# Patient Record
Sex: Female | Born: 1960 | Race: White | Hispanic: No | Marital: Married | Smoking: Former smoker
Health system: Southern US, Community
[De-identification: ages and names within clinical notes are randomized; demographics above are authoritative.]

## PROBLEM LIST (undated history)

## (undated) DIAGNOSIS — H269 Unspecified cataract: Secondary | ICD-10-CM

## (undated) DIAGNOSIS — I10 Essential (primary) hypertension: Secondary | ICD-10-CM

## (undated) HISTORY — DX: Unspecified cataract: H26.9

## (undated) HISTORY — PX: WISDOM TOOTH EXTRACTION: SHX21

## (undated) HISTORY — PX: CATARACT EXTRACTION, BILATERAL: SHX1313

## (undated) HISTORY — DX: Essential (primary) hypertension: I10

---
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
## 1987-12-13 HISTORY — PX: CHOLECYSTECTOMY: SHX55

## 1992-12-12 HISTORY — PX: RIGHT OOPHORECTOMY: SHX2359

## 2000-04-05 ENCOUNTER — Other Ambulatory Visit: Admission: RE | Admit: 2000-04-05 | Discharge: 2000-04-05 | Payer: Self-pay | Admitting: Obstetrics and Gynecology

## 2001-05-23 ENCOUNTER — Other Ambulatory Visit: Admission: RE | Admit: 2001-05-23 | Discharge: 2001-05-23 | Payer: Self-pay | Admitting: Obstetrics and Gynecology

## 2002-06-19 ENCOUNTER — Other Ambulatory Visit: Admission: RE | Admit: 2002-06-19 | Discharge: 2002-06-19 | Payer: Self-pay | Admitting: Obstetrics and Gynecology

## 2003-08-04 ENCOUNTER — Other Ambulatory Visit: Admission: RE | Admit: 2003-08-04 | Discharge: 2003-08-04 | Payer: Self-pay | Admitting: Obstetrics and Gynecology

## 2004-09-01 ENCOUNTER — Other Ambulatory Visit: Admission: RE | Admit: 2004-09-01 | Discharge: 2004-09-01 | Payer: Self-pay | Admitting: Obstetrics and Gynecology

## 2005-10-10 ENCOUNTER — Other Ambulatory Visit: Admission: RE | Admit: 2005-10-10 | Discharge: 2005-10-10 | Payer: Self-pay | Admitting: Obstetrics and Gynecology

## 2005-10-24 ENCOUNTER — Encounter: Admission: RE | Admit: 2005-10-24 | Discharge: 2005-10-24 | Payer: Self-pay | Admitting: Obstetrics and Gynecology

## 2011-01-01 ENCOUNTER — Encounter: Payer: Self-pay | Admitting: Obstetrics and Gynecology

## 2011-11-09 ENCOUNTER — Other Ambulatory Visit: Payer: Self-pay | Admitting: Obstetrics and Gynecology

## 2011-11-09 DIAGNOSIS — R928 Other abnormal and inconclusive findings on diagnostic imaging of breast: Secondary | ICD-10-CM

## 2011-11-23 ENCOUNTER — Ambulatory Visit
Admission: RE | Admit: 2011-11-23 | Discharge: 2011-11-23 | Disposition: A | Discharge status: Home / Self Care | Payer: BC Managed Care – PPO | Source: Ambulatory Visit | Attending: Obstetrics and Gynecology | Admitting: Obstetrics and Gynecology

## 2011-11-23 DIAGNOSIS — R928 Other abnormal and inconclusive findings on diagnostic imaging of breast: Secondary | ICD-10-CM

## 2011-12-13 HISTORY — PX: COLONOSCOPY: SHX174

## 2012-05-29 ENCOUNTER — Encounter: Payer: Self-pay | Admitting: Gastroenterology

## 2012-07-13 ENCOUNTER — Encounter: Payer: Self-pay | Admitting: Gastroenterology

## 2012-07-13 ENCOUNTER — Ambulatory Visit (AMBULATORY_SURGERY_CENTER): Payer: BC Managed Care – PPO | Admitting: *Deleted

## 2012-07-13 VITALS — Ht 63.0 in | Wt 160.0 lb

## 2012-07-13 DIAGNOSIS — Z1211 Encounter for screening for malignant neoplasm of colon: Secondary | ICD-10-CM

## 2012-07-13 MED ORDER — MOVIPREP 100 G PO SOLR: ORAL | Status: DC

## 2012-07-27 ENCOUNTER — Ambulatory Visit (AMBULATORY_SURGERY_CENTER): Payer: BC Managed Care – PPO | Admitting: Gastroenterology

## 2012-07-27 ENCOUNTER — Encounter: Payer: Self-pay | Admitting: Gastroenterology

## 2012-07-27 VITALS — BP 139/77 | HR 103 | Temp 99.1°F | Resp 18 | Ht 63.0 in | Wt 160.0 lb

## 2012-07-27 DIAGNOSIS — Z1211 Encounter for screening for malignant neoplasm of colon: Secondary | ICD-10-CM

## 2012-07-27 MED ORDER — SODIUM CHLORIDE 0.9 % IV SOLN: 500.0000 mL | INTRAVENOUS | Status: DC

## 2012-07-27 NOTE — Op Note (Signed)
[facility] [address] [place], [place]  [address]  COLONOSCOPY PROCEDURE REPORT  PATIENT:  Renee York, Renee York  MR#:  [id number] BIRTHDATE:  11-Jan-1961, 50 yrs. old  GENDER:  female ENDOSCOPIST:  Rachael Fee, MD REF. BY:  [place] Corn, M.D. PROCEDURE DATE:  07/27/2012 PROCEDURE:  Colonoscopy 78295 ASA CLASS:  Class II INDICATIONS:  Routine Risk Screening MEDICATIONS:   Fentanyl 75 mcg IV, These medications were titrated to patient response per physician's verbal order, Versed 6 mg IV  DESCRIPTION OF PROCEDURE:   After the risks benefits and alternatives of the procedure were thoroughly explained, informed consent was obtained.  Digital rectal exam was performed and revealed no rectal masses.   The LB CF-[id number] endoscope was introduced through the anus and advanced to the cecum, which was identified by both the appendix and ileocecal valve, without limitations.  The quality of the prep was good..  The instrument was then slowly withdrawn as the colon was fully examined. <<PROCEDUREIMAGES>> FINDINGS:  A normal appearing cecum, ileocecal valve, and appendiceal orifice were identified. The ascending, hepatic flexure, transverse, splenic flexure, descending, sigmoid colon, and rectum appeared unremarkable (see image3, image4, and image6). Retroflexed views in the rectum revealed no abnormalities. COMPLICATIONS:  None  ENDOSCOPIC IMPRESSION: 1) Normal colon 2) No polyps or cancers  RECOMMENDATIONS: 1) You should continue to follow colorectal cancer screening guidelines for "routine risk" patients with a repeat colonoscopy in 10 years. There is no need for FOBT (stool) testing for at least 5 years.  REPEAT EXAM:  10 years  ______________________________ Rachael Fee, MD  n. eSIGNED:   Rachael Fee at 07/27/2012 09:49 AM  Hoyle Barr, [phone]

## 2012-07-27 NOTE — Progress Notes (Signed)
Patient did not experience any of the following events: a burn prior to discharge; a fall within the facility; wrong site/side/patient/procedure/implant event; or a hospital transfer or hospital admission upon discharge from the facility. (G8907) Patient did not have preoperative order for IV antibiotic SSI prophylaxis. (G8918)  

## 2012-07-27 NOTE — Patient Instructions (Addendum)

## 2012-07-30 ENCOUNTER — Telehealth: Payer: Self-pay | Admitting: *Deleted

## 2012-07-30 NOTE — Telephone Encounter (Signed)
  Follow up Call-  Call back number 07/27/2012  Post procedure Call Back phone  # [phone]  Permission to leave phone message Yes     Patient questions:  Do you have a fever, pain , or abdominal swelling? no Pain Score  0 *  Have you tolerated food without any problems? yes  Have you been able to return to your normal activities? yes  Do you have any questions about your discharge instructions: Diet   no Medications  no Follow up visit  no  Do you have questions or concerns about your Care? no  Actions: * If pain score is 4 or above: No action needed, pain <4.

## 2013-11-09 IMAGING — US US BREAST R
1 series · 8 of 8 positions shown · non-contrast
Comparison: 10/31/2011 and earlier

CLINICAL DATA: The patient returns after screening study for
evaluation of the right breast.

DIGITAL DIAGNOSTIC RIGHT MAMMOGRAM  AND RIGHT BREAST ULTRASOUND:

[Series 1: us breast right · 8 of 8 slices shown]
[im 1/8]
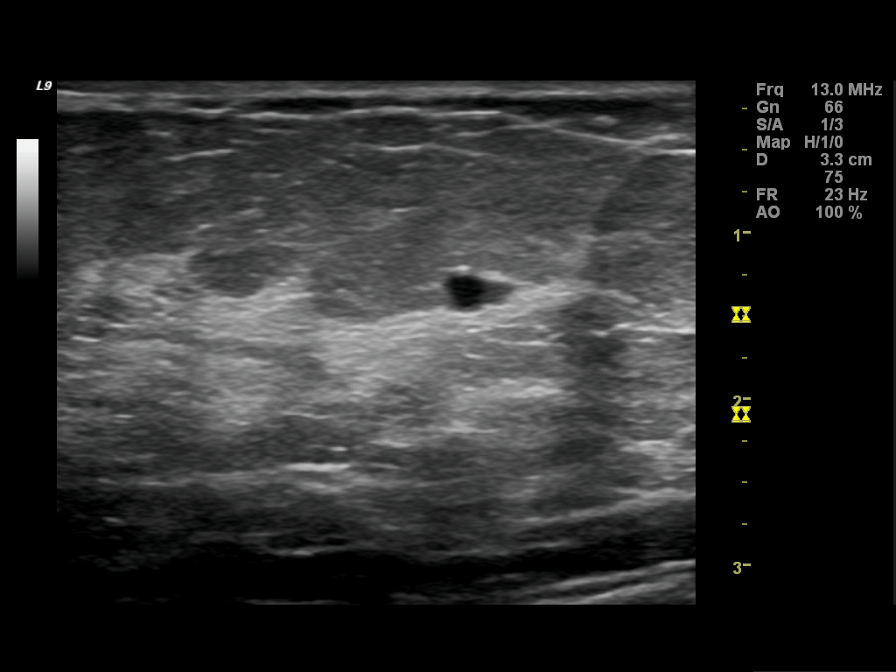
[im 2/8]
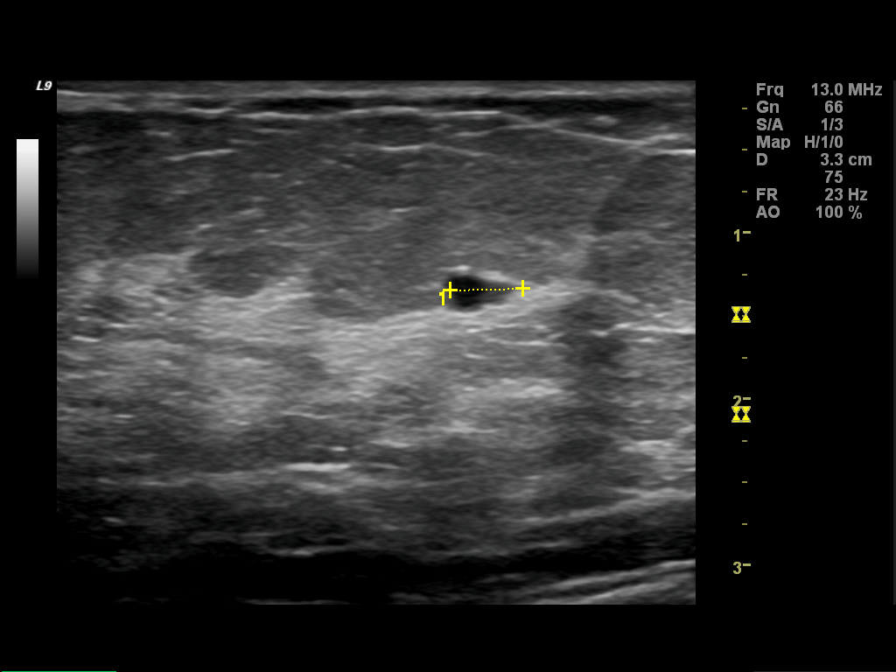
[im 3/8]
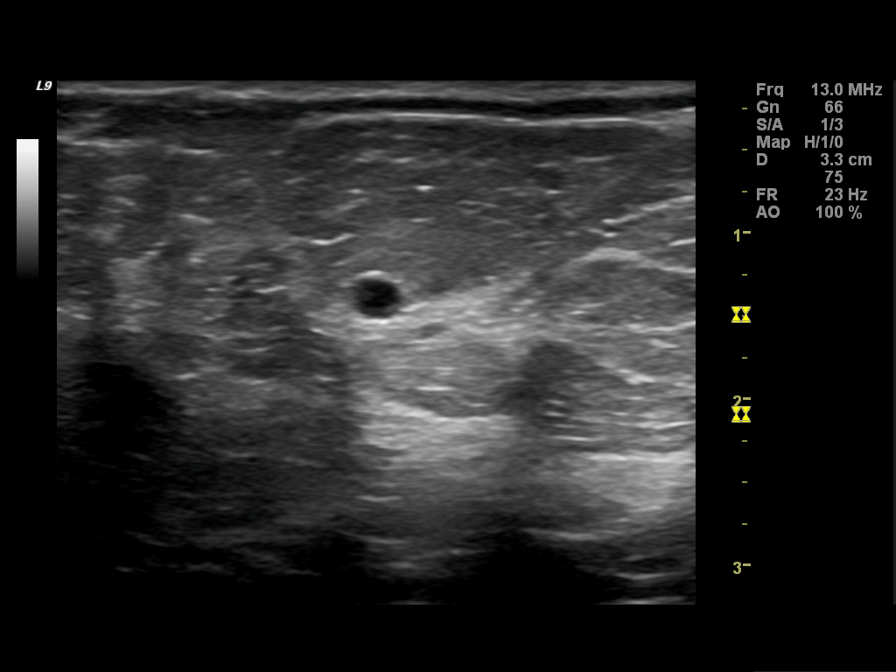
[im 4/8]
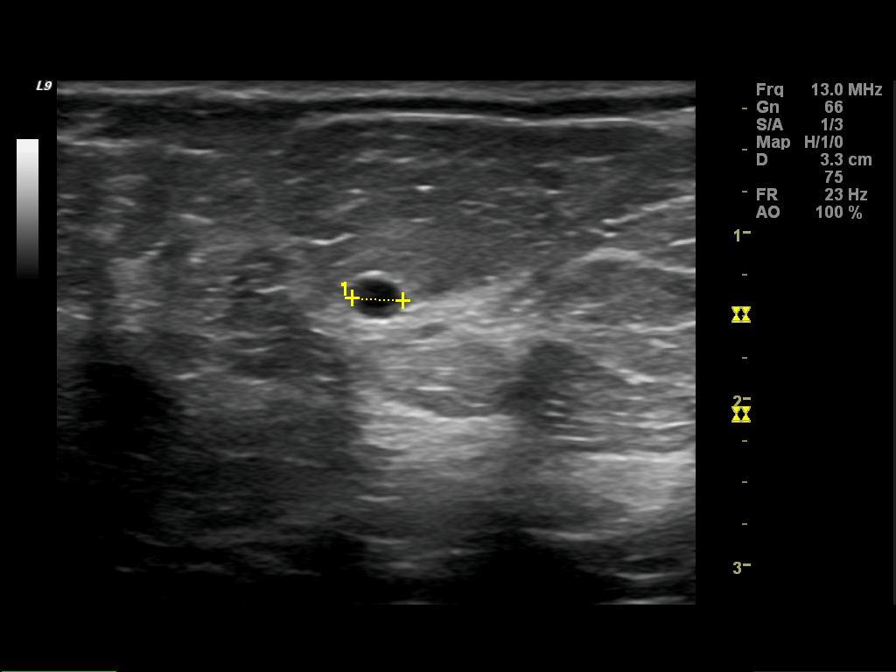
[im 5/8]
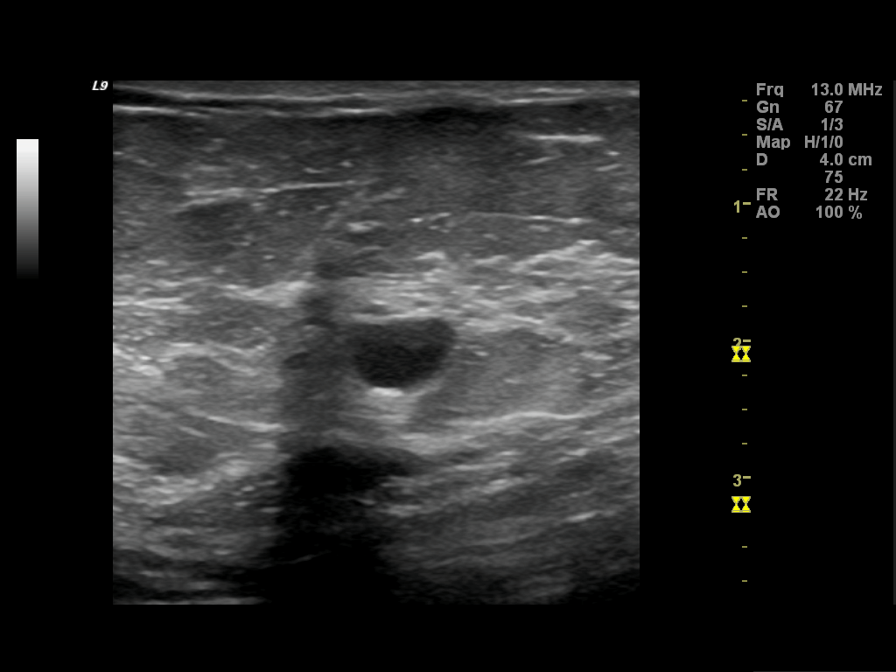
[im 6/8]
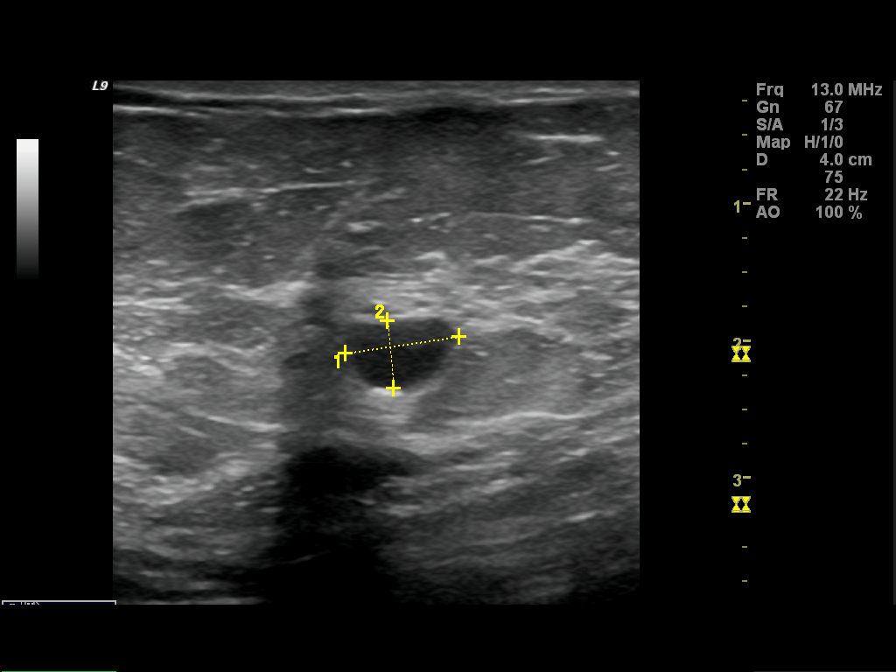
[im 7/8]
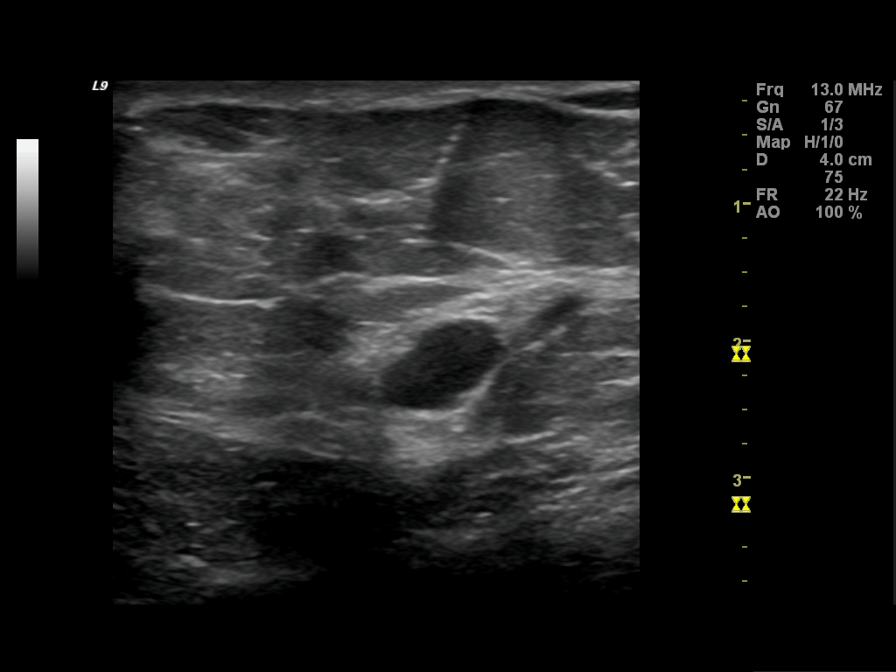
[im 8/8]
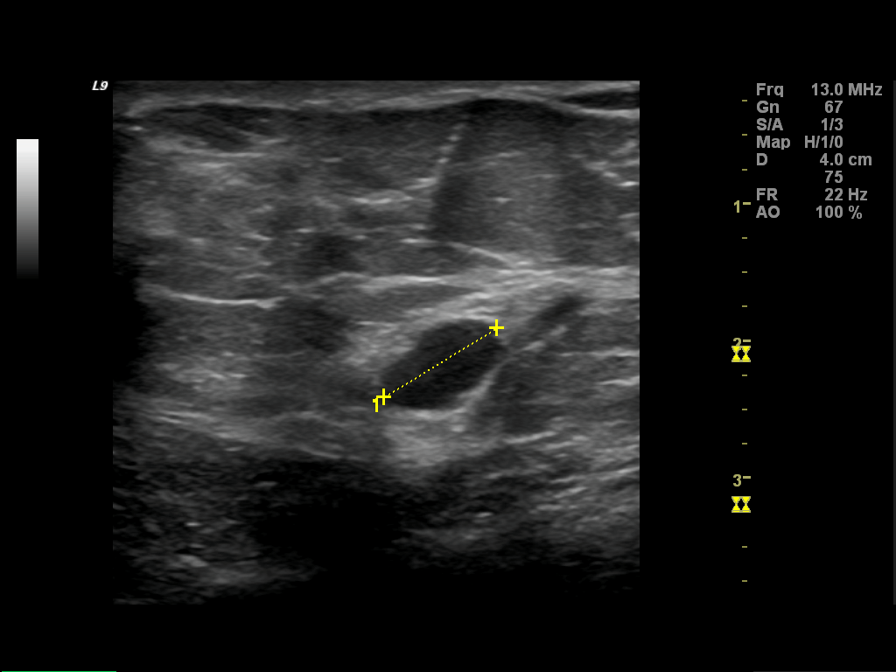

[8 of 8 positions shown; findings below may reference images not displayed]

FINDINGS: Spot compression views confirm presence of a nodular
density in the lower central portion of the right breast.  A second
circumscribed nodule is identified in the medial central portion of
the breast.

On physical exam, I palpate no abnormality in the lower or central
portion of the right breast.

Ultrasound is performed, showing a simple cyst in the 6 o'clock
location of the right breast 2 cm from the nipple.  This measures 8
x 5 x 10 mm.  A small cyst is identified in the 1 o'clock location
5 cm from the nipple which measures 4 x 380 mm.  No solid mass or
area of acoustic shadowing identified.
IMPRESSION: Persistent abnormalities correspond to benign cysts by ultrasound.
Screening mammogram is recommended in one year.

BI-RADS CATEGORY 2:  Benign finding(s).

## 2016-11-25 ENCOUNTER — Other Ambulatory Visit (HOSPITAL_COMMUNITY): Payer: Self-pay | Admitting: Obstetrics and Gynecology

## 2016-11-25 DIAGNOSIS — R011 Cardiac murmur, unspecified: Secondary | ICD-10-CM

## 2016-12-08 ENCOUNTER — Ambulatory Visit (HOSPITAL_COMMUNITY)
Admission: RE | Admit: 2016-12-08 | Discharge: 2016-12-08 | Disposition: A | Discharge status: Home / Self Care | Payer: BC Managed Care – PPO | Source: Ambulatory Visit | Attending: Internal Medicine | Admitting: Internal Medicine

## 2016-12-08 DIAGNOSIS — R011 Cardiac murmur, unspecified: Secondary | ICD-10-CM | POA: Insufficient documentation

## 2016-12-08 LAB — ECHOCARDIOGRAM COMPLETE
CHL CUP MV DEC (S): 235
CHL CUP PV REG GRAD DIAS: 7 mmHg
CHL CUP RV SYS PRESS: 10 mmHg
E decel time: 235 msec
E/e' ratio: 10.31
FS: 36 % (ref 28–44)
IVS/LV PW RATIO, ED: 0.78
LA ID, A-P, ES: 32 mm
LA diam end sys: 32 mm
LA diam index: 1.76 cm/m2
LA vol A4C: 51.2 ml
LA vol index: 24.8 mL/m2
LA vol: 45 mL
LV E/e' medial: 10.31
LV E/e'average: 10.31
LV PW d: 8.22 mm — AB (ref 0.6–1.1)
LV e' LATERAL: 8.59 cm/s
LVOT SV: 65 mL
LVOT VTI: 22.8 cm
LVOT area: 2.84 cm2
LVOT diameter: 19 mm
LVOT peak grad rest: 5 mmHg
LVOTPV: 113 cm/s
Lateral S' vel: 15.7 cm/s
MV pk A vel: 108 m/s
MVPG: 3 mmHg
MVPKEVEL: 88.6 m/s
PV Reg vel dias: 132 cm/s
Reg peak vel: 132 cm/s
TAPSE: 26.1 mm
TDI e' lateral: 8.59
TDI e' medial: 8.81
TR max vel: 132 cm/s

## 2016-12-08 NOTE — Progress Notes (Signed)
  Echocardiogram 2D Echocardiogram has been performed.  Renee York 12/08/2016, 11:49 AM

## 2016-12-15 NOTE — Progress Notes (Signed)
Cardiology Office Note   Date:  12/19/2016   ID:  Renee York, DOB 01-05-1961, MRN [id number]  PCP:  Gwen PoundsUSSO,JOHN M, MD  Cardiologist:   Charlton HawsPeter Nishan, MD   Chief Complaint  Patient presents with  . Heart Murmur      History of Present Illness: Renee York is a 56 y.o. female who presents for evaluation of an abnormal echo.  I reviewed study read by Dr Burgess Estelleoituro on 12/08/16 Study done for ? Murmur.  Note from 11/23/16 describes 2/6 SEM along left lateral sternal border Echo is essentially normal with only mention of grade one diastolic relaxation abnormality There was normal RV/LV function. No valve disease and normal PA pressures She has a history of HTN on RX.  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 Hypertension     Past Surgical History:  Procedure Laterality Date  . ADENOIDECTOMY  1963  . CHOLECYSTECTOMY  1989  . RIGHT OOPHORECTOMY  1994     Current Outpatient Prescriptions  Medication Sig Dispense Refill  . lisinopril (PRINIVIL,ZESTRIL) 20 MG tablet Take 20 mg by mouth daily.     No current facility-administered medications for this visit.     Allergies:   Novocain [procaine hcl]    Social History:  The patient  reports that she has quit smoking. She has never used smokeless tobacco. She reports that she drinks about 1.2 oz of alcohol per week . She reports that she does not use drugs.   Family History:  The patient's family history is not on file.    ROS:  Please see the history of present illness.   Otherwise, review of systems are positive for none.   All other systems are reviewed and negative.    PHYSICAL EXAM: VS:  BP 130/80   Pulse 90   Ht 5\' 3"  (1.6 m)   Wt 165 lb 12.8 oz (75.2 kg)   SpO2 99%   BMI 29.37 kg/m  , BMI Body mass index is 29.37 kg/m. Affect appropriate Healthy:  appears stated age HEENT: normal Neck supple with  no adenopathy JVP normal no bruits no thyromegaly Lungs clear with no wheezing and good diaphragmatic motion Heart:  S1/S2 no murmur, no rub, gallop or click PMI normal Abdomen: benighn, BS positve, no tenderness, no AAA no bruit.  No HSM or HJR Distal pulses intact with no bruits No edema Neuro non-focal Skin warm and dry No muscular weakness    EKG:  NSR rate 92 normal    Recent Labs: No results found for requested labs within last 8760 hours.    Lipid Panel No results found for: CHOL, TRIG, HDL, CHOLHDL, VLDL, LDLCALC, LDLDIRECT    Wt Readings from Last 3 Encounters:  12/19/16 165 lb 12.8 oz (75.2 kg)  07/27/12 160 lb (72.6 kg)  07/13/12 160 lb (72.6 kg)      Other studies Reviewed: Additional studies/ records that were reviewed today include: Echo notes from Dr Timothy Lassousso And McComb .    ASSESSMENT AND PLAN:  1.  Murmur I do not hear one on exam today echo no valve disease 2. Abnormal Echo:  Benign more prominent LV filling with atrial kick consistant With history of HTN and age 89. HTN: Well controlled.  Continue current medications and low sodium Dash type diet.   4. 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  The patient does not have concerns regarding medicines.  The following changes have been made:  no change  Labs/ tests ordered today include: None   Orders Placed This Encounter  Procedures  . EKG 12-Lead     Disposition:   FU with [place] PRN      Signed, Charlton Haws, MD  12/19/2016 3:48 PM    [facility] [address] [place], [place], [place]  [address] Phone: [phone]; Fax: [phone]

## 2016-12-19 ENCOUNTER — Ambulatory Visit (INDEPENDENT_AMBULATORY_CARE_PROVIDER_SITE_OTHER): Payer: BC Managed Care – PPO | Admitting: Cardiovascular Disease

## 2016-12-19 ENCOUNTER — Encounter (INDEPENDENT_AMBULATORY_CARE_PROVIDER_SITE_OTHER): Payer: Self-pay

## 2016-12-19 ENCOUNTER — Encounter: Payer: Self-pay | Admitting: Cardiovascular Disease

## 2016-12-19 VITALS — BP 130/80 | HR 90 | Ht 63.0 in | Wt 165.8 lb

## 2016-12-19 DIAGNOSIS — R011 Cardiac murmur, unspecified: Secondary | ICD-10-CM | POA: Diagnosis not present

## 2016-12-19 NOTE — Patient Instructions (Addendum)
Medication Instructions:  Your physician recommends that you continue on your current medications as directed. Please refer to the Current Medication list given to you today.  Labwork: NONE  Testing/Procedures: NONE  Follow-Up: Your physician wants you to follow-up as needed with  Dr. Nishan.    If you need a refill on your cardiac medications before your next appointment, please call your pharmacy.    

## 2021-11-19 ENCOUNTER — Other Ambulatory Visit: Payer: Self-pay

## 2021-11-19 ENCOUNTER — Other Ambulatory Visit (HOSPITAL_BASED_OUTPATIENT_CLINIC_OR_DEPARTMENT_OTHER): Payer: Self-pay

## 2021-11-19 ENCOUNTER — Ambulatory Visit: Payer: Self-pay | Attending: Internal Medicine

## 2021-11-19 DIAGNOSIS — Z23 Encounter for immunization: Secondary | ICD-10-CM

## 2021-11-19 MED ORDER — PFIZER COVID-19 VAC BIVALENT 30 MCG/0.3ML IM SUSP
INTRAMUSCULAR | 0 refills | Status: DC
  Filled 2021-11-19: qty 0.3, 1d supply, fill #0

## 2021-11-19 NOTE — Progress Notes (Signed)
   Covid-19 Vaccination Clinic  Name:  Renee York    MRN: [id number] DOB: [birth year]  11/19/2021  Renee York was observed post Covid-19 immunization for 15 minutes without incident. She was provided with Vaccine Information Sheet and instruction to access the V-Safe system.   Renee York was instructed to call 911 with any severe reactions post vaccine: Difficulty breathing  Swelling of face and throat  A fast heartbeat  A bad rash all over body  Dizziness and weakness   Immunizations Administered     Name Date Dose VIS Date Route   Pfizer Covid-19 Vaccine Bivalent Booster 11/19/2021  2:04 PM 0.3 mL 08/11/2021 Intramuscular   Manufacturer: [employer], [employer]   Lot: [id number]   NDC: [phone]

## 2022-01-07 ENCOUNTER — Other Ambulatory Visit (HOSPITAL_BASED_OUTPATIENT_CLINIC_OR_DEPARTMENT_OTHER): Payer: Self-pay

## 2022-01-07 MED ORDER — ZOSTER VAC RECOMB ADJUVANTED 50 MCG/0.5ML IM SUSR
INTRAMUSCULAR | 1 refills | Status: DC
  Filled 2022-01-07: qty 0.5, 1d supply, fill #0
  Filled 2022-04-01: qty 0.5, 1d supply, fill #1

## 2022-04-01 ENCOUNTER — Other Ambulatory Visit (HOSPITAL_BASED_OUTPATIENT_CLINIC_OR_DEPARTMENT_OTHER): Payer: Self-pay

## 2022-06-01 ENCOUNTER — Encounter: Payer: Self-pay | Admitting: Gastroenterology

## 2022-07-04 ENCOUNTER — Ambulatory Visit (AMBULATORY_SURGERY_CENTER): Payer: Self-pay

## 2022-07-04 VITALS — Ht 63.0 in | Wt 162.0 lb

## 2022-07-04 DIAGNOSIS — Z1211 Encounter for screening for malignant neoplasm of colon: Secondary | ICD-10-CM

## 2022-07-04 MED ORDER — NA SULFATE-K SULFATE-MG SULF 17.5-3.13-1.6 GM/177ML PO SOLN: 1.0000 | Freq: Once | ORAL | 0 refills | Status: AC

## 2022-07-04 NOTE — Progress Notes (Signed)
No egg or soy allergy known to patient  No issues known to pt with past sedation with any surgeries or procedures Patient denies ever being told they had issues or difficulty with intubation  No FH of Malignant Hyperthermia Pt is not on diet pills Pt is not on home 02  Pt is not on blood thinners  Pt denies issues with constipation  No A fib or A flutter Have any cardiac testing pending--NO Pt instructed to use [email or web address] or GoodRx for a price reduction on prep   

## 2022-07-19 ENCOUNTER — Telehealth: Payer: Self-pay | Admitting: *Deleted

## 2022-07-19 NOTE — Telephone Encounter (Signed)
Pt rescheduled per front desk with Dr. Barron Alvine 08-02-22 at 11:00 am.  New instructions sent via [facility]

## 2022-07-26 ENCOUNTER — Encounter: Payer: Self-pay | Admitting: Gastroenterology

## 2022-08-01 ENCOUNTER — Encounter: Payer: Self-pay | Admitting: Gastroenterology

## 2022-08-02 ENCOUNTER — Encounter: Payer: Self-pay | Admitting: Gastroenterology

## 2022-08-02 ENCOUNTER — Ambulatory Visit (AMBULATORY_SURGERY_CENTER): Payer: BC Managed Care – PPO | Admitting: Gastroenterology

## 2022-08-02 VITALS — BP 117/76 | HR 73 | Temp 97.8°F | Resp 13 | Ht 63.0 in | Wt 162.0 lb

## 2022-08-02 DIAGNOSIS — Z1211 Encounter for screening for malignant neoplasm of colon: Secondary | ICD-10-CM

## 2022-08-02 MED ORDER — SODIUM CHLORIDE 0.9 % IV SOLN: 500.0000 mL | Freq: Once | INTRAVENOUS | Status: DC

## 2022-08-02 NOTE — Progress Notes (Unsigned)
GASTROENTEROLOGY PROCEDURE H&P NOTE   Primary Care Physician: [place] Corn, MD    Reason for Procedure:  Colon Cancer screening  Plan:    Colonoscopy  Patient is appropriate for endoscopic procedure(s) in the ambulatory ([facility]) setting.  The nature of the procedure, as well as the risks, benefits, and alternatives were carefully and thoroughly reviewed with the patient. Ample time for discussion and questions allowed. The patient understood, was satisfied, and agreed to proceed.     HPI: Renee York is a 61 y.o. female who presents for colonoscopy for routine Colon Cancer screening.  No active GI symptoms.  No known family history of colon cancer or related malignancy.  Patient is otherwise without complaints or active issues today.  Last colonoscopy was 07/2012 and normal/no polyps.  Past Medical History:  Diagnosis Date   Cataract    bilateral sx   Hypertension    on meds    Past Surgical History:  Procedure Laterality Date   ADENOIDECTOMY  1963   CATARACT EXTRACTION, BILATERAL Bilateral    CHOLECYSTECTOMY  1989   COLONOSCOPY  2013   DJ-movi(good)-normal-10 yr recall   RIGHT OOPHORECTOMY  1994   WISDOM TOOTH EXTRACTION      Prior to Admission medications   Medication Sig Start Date End Date Taking? Authorizing Provider  CRANBERRY PO Take 650 mg by mouth daily at 6 (six) AM.   Yes [provider]  lisinopril (PRINIVIL,ZESTRIL) 20 MG tablet Take 20 mg by mouth daily. 11/25/16  Yes [provider]  Multiple Vitamin (MULTIVITAMIN PO) Take 1 tablet by mouth daily at 6 (six) AM.   Yes [provider]  vitamin C (ASCORBIC ACID) 500 MG tablet Take 500 mg by mouth daily.   Yes [provider]  VITAMIN D PO Take 1 capsule by mouth daily. 10/15/18  Yes [provider]    Current Outpatient Medications  Medication Sig Dispense Refill   CRANBERRY PO Take 650 mg by mouth daily at 6 (six) AM.     lisinopril  (PRINIVIL,ZESTRIL) 20 MG tablet Take 20 mg by mouth daily.     Multiple Vitamin (MULTIVITAMIN PO) Take 1 tablet by mouth daily at 6 (six) AM.     vitamin C (ASCORBIC ACID) 500 MG tablet Take 500 mg by mouth daily.     VITAMIN D PO Take 1 capsule by mouth daily.     Current Facility-Administered Medications  Medication Dose Route Frequency Provider Last Rate Last Admin   0.9 %  sodium chloride infusion  500 mL Intravenous Once Saveah Bahar V, DO        Allergies as of 08/02/2022 - Review Complete 08/02/2022  Allergen Reaction Noted   Novocain [procaine hcl] Nausea And Vomiting 07/13/2012   Procaine Nausea And Vomiting 03/29/2010    Family History  Problem Relation Age of Onset   Colon polyps Neg Hx    Colon cancer Neg Hx    Esophageal cancer Neg Hx    Rectal cancer Neg Hx    Stomach cancer Neg Hx     Social History   Socioeconomic History   Marital status: Married    Spouse name: Not on file   Number of children: Not on file   Years of education: Not on file   Highest education level: Not on file  Occupational History   Not on file  Tobacco Use   Smoking status: Former   Smokeless tobacco: Never  Vaping Use   Vaping Use: Never used  Substance and Sexual Activity   Alcohol use: Yes    Alcohol/week: 0.0 - 2.0 standard drinks of alcohol   Drug use: No   Sexual activity: Not on file  Other Topics Concern   Not on file  Social History Narrative   Not on file   Social Determinants of Health   Financial Resource Strain: Not on file  Food Insecurity: Not on file  Transportation Needs: Not on file  Physical Activity: Not on file  Stress: Not on file  Social Connections: Not on file  Intimate Partner Violence: Not on file    Physical Exam: Vital signs in last 24 hours: @BP  (!) 145/79   Pulse 96   Temp 97.8 F (36.6 C) (Temporal)   Ht 5\' 3"  (1.6 m)   Wt 162 lb (73.5 kg)   SpO2 98%   BMI 28.70 kg/m  GEN: NAD EYE: Sclerae anicteric ENT: MMM CV:  Non-tachycardic Pulm: CTA b/l GI: Soft, NT/ND NEURO:  Alert & Oriented x 3   , DO [facility]   08/02/2022 11:15 AM

## 2022-08-02 NOTE — Op Note (Signed)
[facility] Patient Name: Renee York Procedure Date: 08/02/2022 11:20 AM MRN: [id number] Endoscopist: Doristine Locks , MD Age: 61 Referring MD:  Date of Birth: Feb 21, 1961 Gender: Female Account #: [id number] Procedure:                Colonoscopy Indications:              Screening for colorectal malignant neoplasm (last                            colonoscopy was 10 years ago)                           Last colonoscopy was 07/2012 and normal/no polyps Medicines:                Monitored Anesthesia Care Procedure:                Pre-Anesthesia Assessment:                           - Prior to the procedure, a History and Physical                            was performed, and patient medications and                            allergies were reviewed. The patient's tolerance of                            previous anesthesia was also reviewed. The risks                            and benefits of the procedure and the sedation                            options and risks were discussed with the patient.                            All questions were answered, and informed consent                            was obtained. Prior Anticoagulants: The patient has                            taken no previous anticoagulant or antiplatelet                            agents. ASA Grade Assessment: II - A patient with                            mild systemic disease. After reviewing the risks                            and benefits, the patient was deemed in  satisfactory condition to undergo the procedure.                           After obtaining informed consent, the colonoscope                            was passed under direct vision. Throughout the                            procedure, the patient's blood pressure, pulse, and                            oxygen saturations were monitored continuously. The                            Olympus CF-[id number]  (Serial# [id number]) Colonoscope was                            introduced through the anus and advanced to the the                            cecum, identified by appendiceal orifice and                            ileocecal valve. The colonoscopy was performed                            without difficulty. The patient tolerated the                            procedure well. The quality of the bowel                            preparation was good. The ileocecal valve,                            appendiceal orifice, and rectum were photographed. Scope In: 11:22:45 AM Scope Out: 11:39:35 AM Scope Withdrawal Time: 0 hours 13 minutes 39 seconds  Total Procedure Duration: 0 hours 16 minutes 50 seconds  Findings:                 The perianal and digital rectal examinations were                            normal.                           The entire colon appeared normal.                           The retroflexed view of the distal rectum and anal                            verge was normal and showed no anal or rectal  abnormalities. Complications:            No immediate complications. Estimated Blood Loss:     Estimated blood loss: none. Impression:               - The entire examined colon is normal.                           - The distal rectum and anal verge are normal on                            retroflexion view.                           - No specimens collected. Recommendation:           - Patient has a contact number available for                            emergencies. The signs and symptoms of potential                            delayed complications were discussed with the                            patient. Return to normal activities tomorrow.                            Written discharge instructions were provided to the                            patient.                           - Resume previous diet.                           - Continue present  medications.                           - Repeat colonoscopy in 10 years for screening                            purposes.                           - Return to GI office PRN. Doristine Locks, MD 08/02/2022 11:43:59 AM

## 2022-08-02 NOTE — Progress Notes (Unsigned)
PT taken to PACU. Monitors in place. VSS. Report given to RN. 

## 2022-08-02 NOTE — Patient Instructions (Addendum)
Please read handouts provided. Continue present medications. Return to GI office as needed. Repeat colonoscopy in 10 years for screening.   YOU HAD AN ENDOSCOPIC PROCEDURE TODAY AT THE [facility]:   Refer to the procedure report that was given to you for any specific questions about what was found during the examination.  If the procedure report does not answer your questions, please call your gastroenterologist to clarify.  If you requested that your care partner not be given the details of your procedure findings, then the procedure report has been included in a sealed envelope for you to review at your convenience later.  YOU SHOULD EXPECT: Some feelings of bloating in the abdomen. Passage of more gas than usual.  Walking can help get rid of the air that was put into your GI tract during the procedure and reduce the bloating. If you had a lower endoscopy (such as a colonoscopy or flexible sigmoidoscopy) you may notice spotting of blood in your stool or on the toilet paper. If you underwent a bowel prep for your procedure, you may not have a normal bowel movement for a few days.  Please Note:  You might notice some irritation and congestion in your nose or some drainage.  This is from the oxygen used during your procedure.  There is no need for concern and it should clear up in a day or so.  SYMPTOMS TO REPORT IMMEDIATELY:  Following lower endoscopy (colonoscopy or flexible sigmoidoscopy):  Excessive amounts of blood in the stool  Significant tenderness or worsening of abdominal pains  Swelling of the abdomen that is new, acute  Fever of 100F or higher  For urgent or emergent issues, a gastroenterologist can be reached at any hour by calling [phone]. Do not use MyChart messaging for urgent concerns.    DIET:  We do recommend a small meal at first, but then you may proceed to your regular diet.  Drink plenty of fluids but you should avoid alcoholic beverages for 24  hours.  ACTIVITY:  You should plan to take it easy for the rest of today and you should NOT DRIVE or use heavy machinery until tomorrow (because of the sedation medicines used during the test).    FOLLOW UP: Our staff will call the number listed on your records the next business day following your procedure.  We will call around 7:15- 8:00 am to check on you and address any questions or concerns that you may have regarding the information given to you following your procedure. If we do not reach you, we will leave a message.  If you develop any symptoms (ie: fever, flu-like symptoms, shortness of breath, cough etc.) before then, please call [phone].  If you test positive for Covid 19 in the 2 weeks post procedure, please call and report this information to [place].    If any biopsies were taken you will be contacted by phone or by letter within the next 1-3 weeks.  Please call us at [phone] if you have not heard about the biopsies in 3 weeks.    SIGNATURES/CONFIDENTIALITY: You and/or your care partner have signed paperwork which will be entered into your electronic medical record.  These signatures attest to the fact that that the information above on your After Visit Summary has been reviewed and is understood.  Full responsibility of the confidentiality of this discharge information lies with you and/or your care-partner.

## 2022-08-02 NOTE — Progress Notes (Signed)
Pt's states no medical or surgical changes since previsit or office visit. 

## 2022-08-03 ENCOUNTER — Telehealth: Payer: Self-pay | Admitting: *Deleted

## 2022-08-03 NOTE — Telephone Encounter (Signed)
  Follow up Call-     08/02/2022   10:31 AM  Call back number  Post procedure Call Back phone  # [phone]  Permission to leave phone message Yes     Patient questions:  Do you have a fever, pain , or abdominal swelling? No. Pain Score  0 *  Have you tolerated food without any problems? Yes.    Have you been able to return to your normal activities? Yes.    Do you have any questions about your discharge instructions: Diet   No. Medications  No. Follow up visit  No.  Do you have questions or concerns about your Care? No.  Actions: * If pain score is 4 or above: No action needed, pain <4.

## 2023-02-03 ENCOUNTER — Other Ambulatory Visit: Payer: Self-pay | Admitting: Internal Medicine

## 2023-02-03 DIAGNOSIS — Z0000 Encounter for general adult medical examination without abnormal findings: Secondary | ICD-10-CM

## 2023-03-09 ENCOUNTER — Ambulatory Visit
Admission: RE | Admit: 2023-03-09 | Discharge: 2023-03-09 | Disposition: A | Discharge status: Home / Self Care | Payer: No Typology Code available for payment source | Source: Ambulatory Visit | Attending: Internal Medicine | Admitting: Internal Medicine

## 2023-03-09 DIAGNOSIS — Z0000 Encounter for general adult medical examination without abnormal findings: Secondary | ICD-10-CM
